# Patient Record
Sex: Female | Born: 1995 | Race: Black or African American | Hispanic: No | Marital: Single | Smoking: Never smoker
Health system: Southern US, Community
[De-identification: ages and names within clinical notes are randomized; demographics above are authoritative.]

## PROBLEM LIST (undated history)

## (undated) DIAGNOSIS — F909 Attention-deficit hyperactivity disorder, unspecified type: Secondary | ICD-10-CM

## (undated) DIAGNOSIS — F32A Depression, unspecified: Secondary | ICD-10-CM

## (undated) DIAGNOSIS — D509 Iron deficiency anemia, unspecified: Secondary | ICD-10-CM

## (undated) HISTORY — PX: RECONSTRUCTION, FACIAL: SHX5034

## (undated) HISTORY — PX: FACIAL RECONSTRUCTION SURGERY: SHX631

---
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
## 2016-04-25 ENCOUNTER — Emergency Department (HOSPITAL_BASED_OUTPATIENT_CLINIC_OR_DEPARTMENT_OTHER)
Admission: EM | Admit: 2016-04-25 | Discharge: 2016-04-25 | Disposition: A | Discharge status: Home / Self Care | Payer: Medicaid Other | Attending: Emergency Medicine | Admitting: Emergency Medicine

## 2016-04-25 ENCOUNTER — Encounter (HOSPITAL_BASED_OUTPATIENT_CLINIC_OR_DEPARTMENT_OTHER): Payer: Self-pay

## 2016-04-25 DIAGNOSIS — J029 Acute pharyngitis, unspecified: Secondary | ICD-10-CM

## 2016-04-25 DIAGNOSIS — R05 Cough: Secondary | ICD-10-CM | POA: Diagnosis present

## 2016-04-25 HISTORY — DX: Attention-deficit hyperactivity disorder, unspecified type: F90.9

## 2016-04-25 LAB — RAPID STREP SCREEN (MED CTR MEBANE ONLY): Streptococcus, Group A Screen (Direct): NEGATIVE

## 2016-04-25 MED ORDER — IBUPROFEN 400 MG PO TABS
600.0000 mg | ORAL_TABLET | Freq: Once | ORAL | Status: AC
  Administered 2016-04-25: 600 mg via ORAL
  Filled 2016-04-25: qty 1

## 2016-04-25 NOTE — ED Notes (Signed)
Sore throat x today-pt NAD-texting in triage

## 2016-04-25 NOTE — ED Provider Notes (Signed)
CSN: [id number]     Arrival date & time 04/25/16  2055 History   By signing my name below, I, Linus Galas, attest that this documentation has been prepared under the direction and in the presence of No att. providers found. Electronically Signed: Linus Galas, ED Scribe. 04/25/2016. 10:32 PM.   Chief Complaint  Patient presents with  . Sore Throat    The history is provided by the patient. No language interpreter was used.   HPI Comments: Amber Hobbs is a 20 y.o. female who presents to the Emergency Department complaining of "burnging" sore throat that began this morning. Pt also reports cough and nasal congestion. Pt has not tried at OTC medications for her symptoms. Pt denies any fever, chills, CP, SOB, N/V/D or any other symptoms at this time.  Immunizations are up to date.  No recent travel. No difficulty breathing or swallowing.    Past Medical History  Diagnosis Date  . ADHD (attention deficit hyperactivity disorder)    Past Surgical History  Procedure Laterality Date  . Facial reconstruction surgery     No family history on file. Social History  Substance Use Topics  . Smoking status: Never Smoker   . Smokeless tobacco: None  . Alcohol Use: No   OB History    No data available     Review of Systems  Constitutional: Negative for fever and chills.  HENT: Positive for congestion and sore throat.   Respiratory: Positive for cough. Negative for shortness of breath.   Cardiovascular: Negative for chest pain.  Gastrointestinal: Negative for nausea, vomiting, abdominal pain and diarrhea.  All other systems reviewed and are negative.  Allergies  Review of patient's allergies indicates no known allergies.  Home Medications   Prior to Admission medications   Not on File   BP 126/80 mmHg  Pulse 93  Temp(Src) 98.5 F (36.9 C) (Oral)  Resp 18  Ht  (1.626 m)  Wt 155 lb (70.308 kg)  BMI 26.59 kg/m2  SpO2 100%  LMP 04/20/2016  Vitals reviewed Physical  Exam  Physical Examination: General appearance - alert, well appearing, and in no distress Mental status - alert, oriented to person, place, and time Eyes - no conjunctival injection, no scleral icterus Mouth - mucous membranes moist, pharynx normal without lesions Chest - clear to auscultation, no wheezes, rales or rhonchi, symmetric air entry Heart - normal rate, regular rhythm, normal S1, S2, no murmurs, rubs, clicks or gallops Abdomen - soft, nontender, nondistended, no masses or organomegaly Neurological - alert, oriented, normal speech Extremities - peripheral pulses normal, no pedal edema, no clubbing or cyanosis Skin - normal coloration and turgor, no rashes  ED Course  Procedures  DIAGNOSTIC STUDIES: Oxygen Saturation is 100% on room air, normal by my interpretation.    COORDINATION OF CARE: 10:30 PM Will give ibuprofen. Will order rapid strep and culture. Discussed treatment plan with pt at bedside and pt agreed to plan.  Labs Review Labs Reviewed  RAPID STREP SCREEN (NOT AT [facility])  CULTURE, GROUP A STREP [facility])    Imaging Review No results found. I have personally reviewed and evaluated these images and lab results as part of my medical decision-making.   EKG Interpretation None      MDM   Final diagnoses:  Pharyngitis    Pt presenting with c/o sore throat x 1 day associated with mild cough and nasal congestion.  No erythema of throat on exam.  Rapid strep negative.   Patient is overall  nontoxic and well hydrated in appearance. Strep culture pending.  Advised ibuprofen and salt water gargle.  Discharged with strict return precautions.  Pt agreeable with plan.   I personally performed the services described in this documentation, which was scribed in my presence. The recorded information has been reviewed and is accurate.     Jerelyn ScottMartha Linker, MD 04/25/16 [phone]

## 2016-04-25 NOTE — Discharge Instructions (Signed)
Return to the ED with any concerns including difficulty breathing, vomiting and not able to keep down liquids, decreased urine output, decreased level of alertness/lethargy, or any other alarming symptoms  °

## 2016-04-28 LAB — CULTURE, GROUP A STREP (THRC)

## 2016-08-18 ENCOUNTER — Emergency Department (HOSPITAL_BASED_OUTPATIENT_CLINIC_OR_DEPARTMENT_OTHER)
Admission: EM | Admit: 2016-08-18 | Discharge: 2016-08-18 | Disposition: A | Discharge status: Home / Self Care | Payer: Medicaid Other | Attending: Dermatology | Admitting: Dermatology

## 2016-08-18 DIAGNOSIS — Z048 Encounter for examination and observation for other specified reasons: Secondary | ICD-10-CM | POA: Insufficient documentation

## 2016-08-18 DIAGNOSIS — Y999 Unspecified external cause status: Secondary | ICD-10-CM | POA: Insufficient documentation

## 2016-08-18 DIAGNOSIS — F909 Attention-deficit hyperactivity disorder, unspecified type: Secondary | ICD-10-CM | POA: Insufficient documentation

## 2016-08-18 DIAGNOSIS — Z5321 Procedure and treatment not carried out due to patient leaving prior to being seen by health care provider: Secondary | ICD-10-CM | POA: Diagnosis not present

## 2016-08-18 DIAGNOSIS — Y929 Unspecified place or not applicable: Secondary | ICD-10-CM | POA: Insufficient documentation

## 2016-08-18 DIAGNOSIS — X58XXXA Exposure to other specified factors, initial encounter: Secondary | ICD-10-CM | POA: Insufficient documentation

## 2016-08-18 DIAGNOSIS — Y939 Activity, unspecified: Secondary | ICD-10-CM | POA: Insufficient documentation

## 2016-08-18 NOTE — ED Triage Notes (Signed)
Have called pt for triage at 2 separate occasions -- pt hasn't been present in waiting room at either time.

## 2016-08-18 NOTE — ED Notes (Signed)
Pt called a 3rd time for triage -- pt not present in waiting room, bathrooms or hallway.

## 2016-11-24 IMAGING — CR DG FOOT COMPLETE 3+V*L*
3 series · 3 of 3 positions shown · non-contrast
Comparison: None.

CLINICAL DATA: The patient ran into a door frame last night
resulting in a left foot injury and pain. Initial encounter.

EXAM:
LEFT FOOT - COMPLETE 3+ VIEW

[t foot ap left]
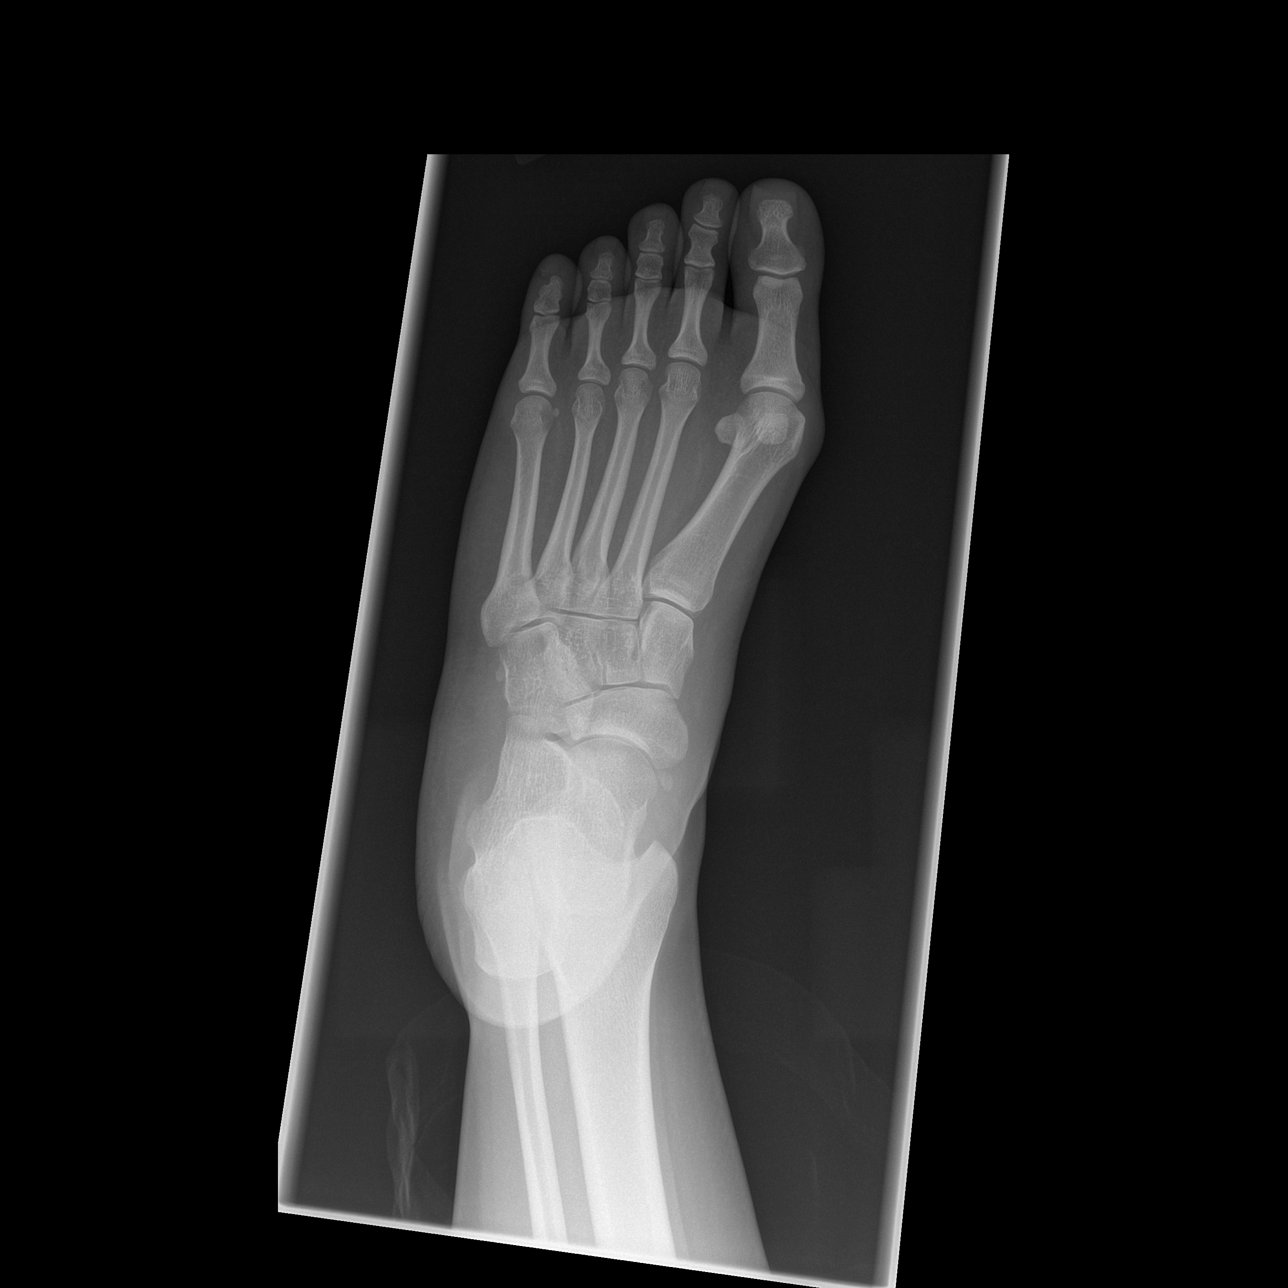

[t foot oblique left]
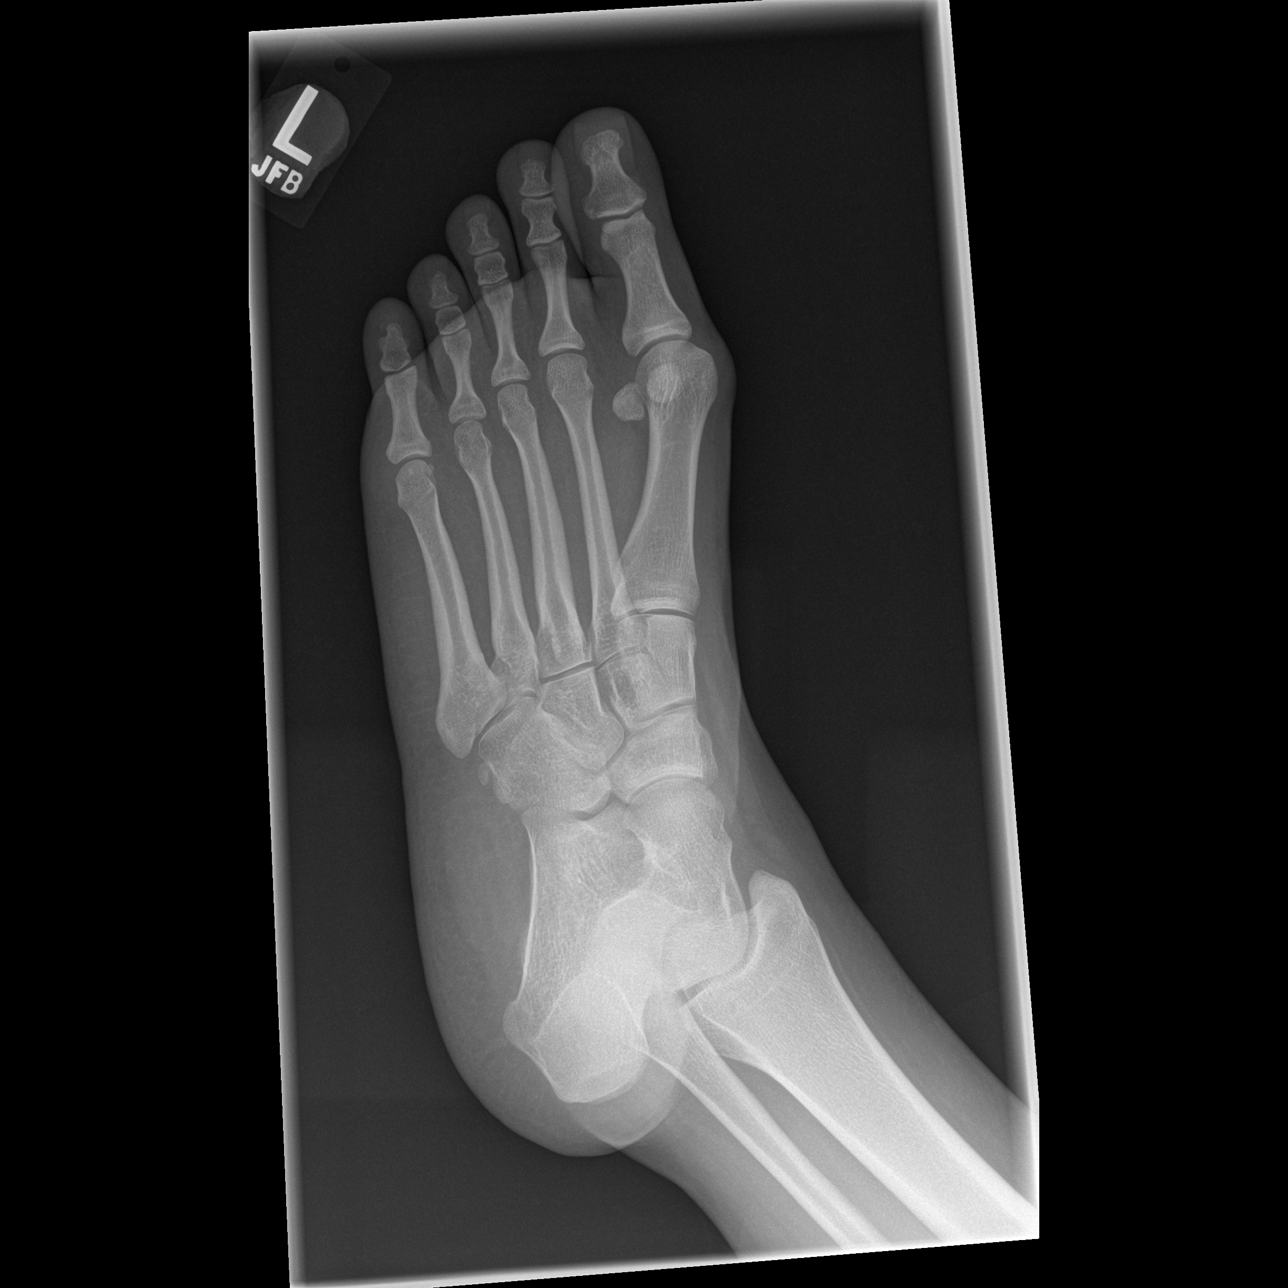

[t foot lat left]
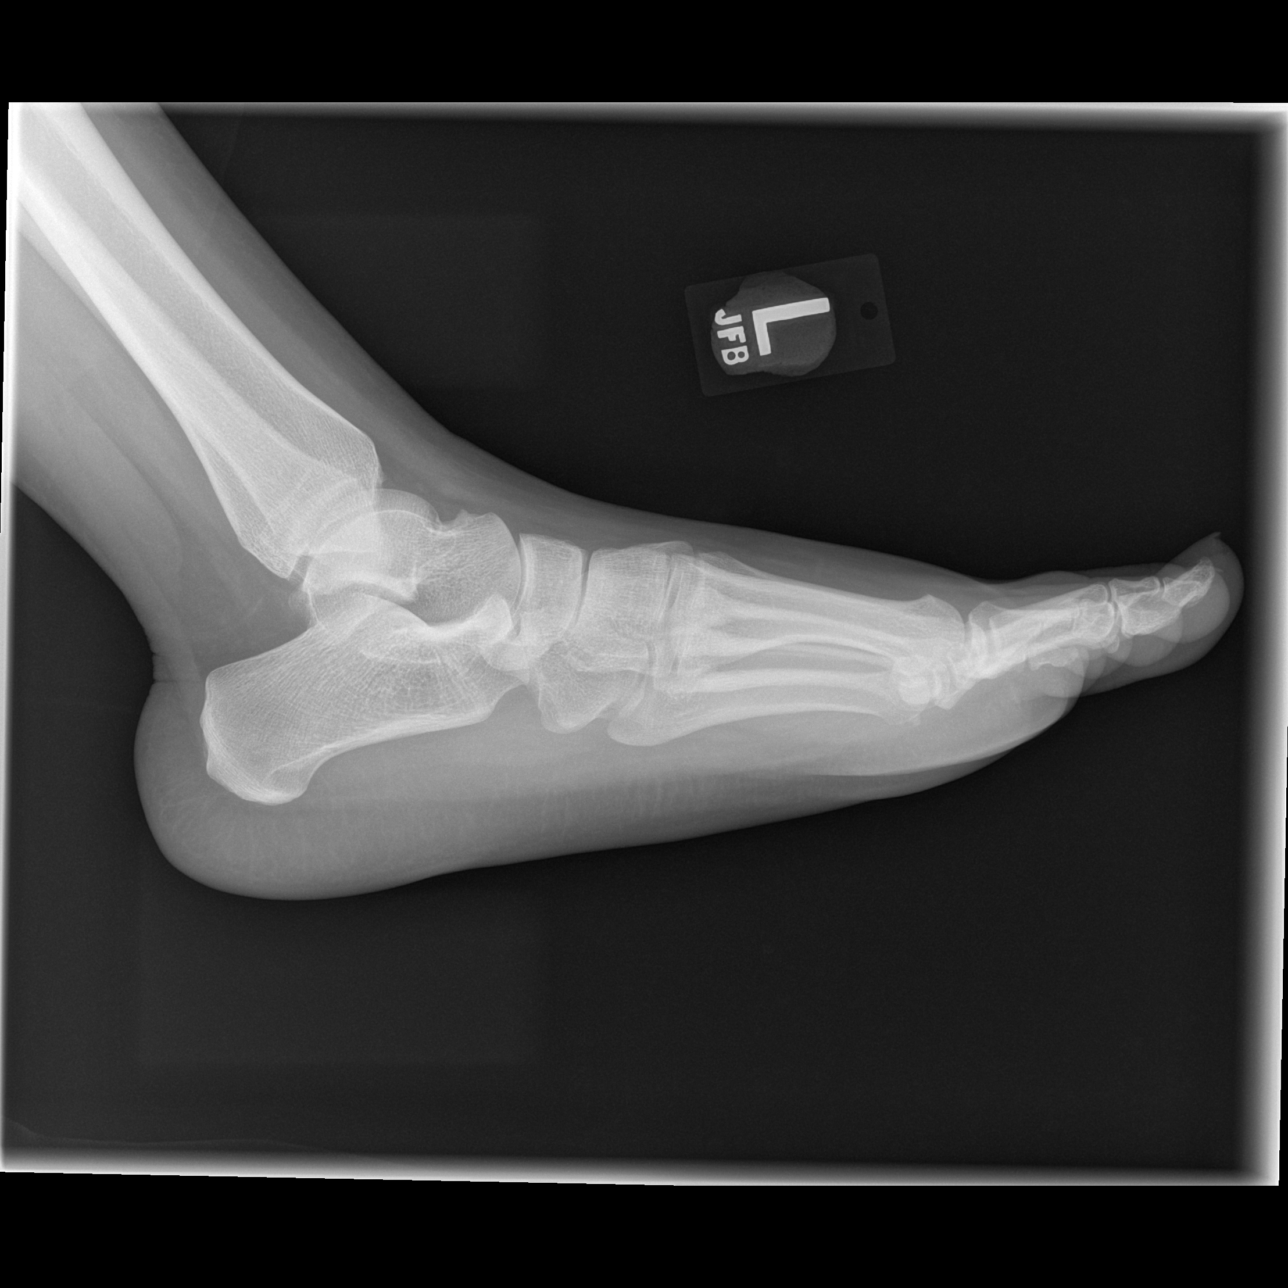

[3 of 3 positions shown; findings below may reference images not displayed]

FINDINGS: No acute bony or joint abnormality is identified. Mild hallux valgus
is noted. Soft tissues are unremarkable.
IMPRESSION: No acute abnormality.

## 2017-04-16 ENCOUNTER — Encounter (HOSPITAL_BASED_OUTPATIENT_CLINIC_OR_DEPARTMENT_OTHER): Payer: Self-pay

## 2017-04-16 ENCOUNTER — Emergency Department (HOSPITAL_BASED_OUTPATIENT_CLINIC_OR_DEPARTMENT_OTHER)
Admission: EM | Admit: 2017-04-16 | Discharge: 2017-04-16 | Disposition: A | Discharge status: Home / Self Care | Payer: Medicaid Other | Attending: Emergency Medicine | Admitting: Emergency Medicine

## 2017-04-16 DIAGNOSIS — F909 Attention-deficit hyperactivity disorder, unspecified type: Secondary | ICD-10-CM | POA: Diagnosis not present

## 2017-04-16 DIAGNOSIS — N760 Acute vaginitis: Secondary | ICD-10-CM | POA: Diagnosis not present

## 2017-04-16 DIAGNOSIS — A5901 Trichomonal vulvovaginitis: Secondary | ICD-10-CM | POA: Insufficient documentation

## 2017-04-16 DIAGNOSIS — B9689 Other specified bacterial agents as the cause of diseases classified elsewhere: Secondary | ICD-10-CM

## 2017-04-16 DIAGNOSIS — J020 Streptococcal pharyngitis: Secondary | ICD-10-CM | POA: Diagnosis not present

## 2017-04-16 DIAGNOSIS — N898 Other specified noninflammatory disorders of vagina: Secondary | ICD-10-CM | POA: Diagnosis present

## 2017-04-16 LAB — URINALYSIS, MICROSCOPIC (REFLEX)

## 2017-04-16 LAB — URINALYSIS, ROUTINE W REFLEX MICROSCOPIC
Bilirubin Urine: NEGATIVE
GLUCOSE, UA: NEGATIVE mg/dL
Ketones, ur: 15 mg/dL — AB
Nitrite: NEGATIVE
PH: 6.5 (ref 5.0–8.0)
Protein, ur: NEGATIVE mg/dL
Specific Gravity, Urine: 1.022 (ref 1.005–1.030)

## 2017-04-16 LAB — WET PREP, GENITAL
Sperm: NONE SEEN
YEAST WET PREP: NONE SEEN

## 2017-04-16 LAB — RAPID STREP SCREEN (MED CTR MEBANE ONLY): Streptococcus, Group A Screen (Direct): POSITIVE — AB

## 2017-04-16 LAB — PREGNANCY, URINE: PREG TEST UR: NEGATIVE

## 2017-04-16 MED ORDER — AMOXICILLIN 500 MG PO CAPS: 500.0000 mg | ORAL_CAPSULE | Freq: Three times a day (TID) | ORAL | 0 refills | Status: AC

## 2017-04-16 MED ORDER — METRONIDAZOLE 500 MG PO TABS: 500.0000 mg | ORAL_TABLET | Freq: Two times a day (BID) | ORAL | 0 refills | Status: AC

## 2017-04-16 NOTE — ED Notes (Signed)
ED Provider at bedside. 

## 2017-04-16 NOTE — ED Notes (Signed)
Pt verbalized understanding of discharge instructions and denies any further questions at this time.   

## 2017-04-16 NOTE — ED Triage Notes (Signed)
Vaginal d/c x 2 weeks-sore thraot x today-NAD-steady gait

## 2017-04-16 NOTE — ED Provider Notes (Signed)
[facility]   CSN: [id number] Arrival date & time: 04/16/17  1516     History   Chief Complaint Chief Complaint  Patient presents with  . Vaginal Discharge    HPI Amber Hobbs is a 21 y.o. female.  HPI Patient presents with yellowish vaginal discharge. Began around 2 weeks ago. No pain. Denies unprotected sex. No dysuria. No abdominal pain. No vaginal pain. Patient also presents with sore throat. Said that the last couple days. No cough. States she has a niece that has strep throat. States that he has tested positive. Patient denies having oral sex.   Past Medical History:  Diagnosis Date  . ADHD (attention deficit hyperactivity disorder)     There are no active problems to display for this patient.   Past Surgical History:  Procedure Laterality Date  . FACIAL RECONSTRUCTION SURGERY      OB History    No data available       Home Medications    Prior to Admission medications   Medication Sig Start Date End Date Taking? Authorizing Provider  amoxicillin (AMOXIL) 500 MG capsule Take 1 capsule (500 mg total) by mouth 3 (three) times daily. 04/16/17   Benjiman Core, MD  metroNIDAZOLE (FLAGYL) 500 MG tablet Take 1 tablet (500 mg total) by mouth 2 (two) times daily. 04/16/17   Benjiman Core, MD    Family History No family history on file.  Social History Social History  Substance Use Topics  . Smoking status: Never Smoker  . Smokeless tobacco: Never Used  . Alcohol use No     Allergies   Patient has no known allergies.   Review of Systems Review of Systems  Constitutional: Negative for appetite change.  HENT: Positive for sore throat. Negative for congestion.   Respiratory: Negative for cough and shortness of breath.   Cardiovascular: Negative for chest pain.  Gastrointestinal: Negative for abdominal pain.  Genitourinary: Positive for vaginal discharge. Negative for dysuria, vaginal bleeding and vaginal pain.    Musculoskeletal: Negative for back pain.  Neurological: Negative for tremors.  Hematological: Negative for adenopathy.  Psychiatric/Behavioral: Negative for confusion.     Physical Exam Updated Vital Signs BP 118/80 (BP Location: Right Arm)   Pulse 71   Temp 98.2 F (36.8 C) (Oral)   Resp 16   Ht 5\' 4"  (1.626 m)   Wt 150 lb (68 kg)   SpO2 100%   BMI 25.75 kg/m   Physical Exam  Constitutional: She appears well-developed and well-nourished.  HENT:  Mild posterior pharyngeal erythema without exudate.  Neck: Neck supple.  Cardiovascular: Normal rate.   Pulmonary/Chest: Effort normal.  Abdominal: There is no tenderness.  Musculoskeletal: She exhibits no edema.  Neurological: She is alert.  Skin: Skin is warm. Capillary refill takes less than 2 seconds.  Psychiatric: She has a normal mood and affect.  Pelvic exam shows white discharge. No cervical motion tenderness. No lesions. No adnexal tenderness.   ED Treatments / Results  Labs (all labs ordered are listed, but only abnormal results are displayed) Labs Reviewed  RAPID STREP SCREEN (NOT AT [facility]) - Abnormal; Notable for the following:       Result Value   Streptococcus, Group A Screen (Direct) POSITIVE (*)    All other components within normal limits  WET PREP, GENITAL - Abnormal; Notable for the following:    Trich, Wet Prep PRESENT (*)    Clue Cells Wet Prep HPF POC PRESENT (*)    WBC,  Wet Prep HPF POC MANY (*)    All other components within normal limits  URINALYSIS, ROUTINE W REFLEX MICROSCOPIC - Abnormal; Notable for the following:    Hgb urine dipstick TRACE (*)    Ketones, ur 15 (*)    Leukocytes, UA MODERATE (*)    All other components within normal limits  URINALYSIS, MICROSCOPIC (REFLEX) - Abnormal; Notable for the following:    Bacteria, UA MANY (*)    Squamous Epithelial / LPF 0-5 (*)    All other components within normal limits  PREGNANCY, URINE  RPR  HIV ANTIBODY (ROUTINE TESTING)  GC/CHLAMYDIA  PROBE AMP ([place]) NOT AT [facility]    EKG  EKG Interpretation None       Radiology No results found.  Procedures Procedures (including critical care time)  Medications Ordered in ED Medications - No data to display   Initial Impression / Assessment and Plan / ED Course  I have reviewed the triage vital signs and the nursing notes.  Pertinent labs & imaging results that were available during my care of the patient were reviewed by me and considered in my medical decision making (see chart for details).     Patient with vaginal discharge and sore throat. Strep positive. Denies oral sex. Will treat for trichomoniasis and BV. Other labs pending. Discharge home.  Final Clinical Impressions(s) / ED Diagnoses   Final diagnoses:  BV (bacterial vaginosis)  Trichomonas vaginitis  Strep pharyngitis    New Prescriptions New Prescriptions   AMOXICILLIN (AMOXIL) 500 MG CAPSULE    Take 1 capsule (500 mg total) by mouth 3 (three) times daily.   METRONIDAZOLE (FLAGYL) 500 MG TABLET    Take 1 tablet (500 mg total) by mouth 2 (two) times daily.     Benjiman CorePickering, Fifi Schindler, MD 04/16/17 [id number]

## 2017-04-17 LAB — GC/CHLAMYDIA PROBE AMP (~~LOC~~) NOT AT ARMC
Chlamydia: NEGATIVE
Neisseria Gonorrhea: NEGATIVE

## 2017-04-17 LAB — RPR: RPR Ser Ql: NONREACTIVE

## 2017-04-17 LAB — HIV ANTIBODY (ROUTINE TESTING W REFLEX): HIV Screen 4th Generation wRfx: NONREACTIVE

## 2023-06-25 ENCOUNTER — Emergency Department
Admission: EM | Admit: 2023-06-25 | Discharge: 2023-06-25 | Disposition: A | Discharge status: Home / Self Care | Payer: Medicaid Other | Attending: Emergency Medicine | Admitting: Emergency Medicine

## 2023-06-25 ENCOUNTER — Encounter (INDEPENDENT_AMBULATORY_CARE_PROVIDER_SITE_OTHER): Payer: Self-pay

## 2023-06-25 ENCOUNTER — Encounter (INDEPENDENT_AMBULATORY_CARE_PROVIDER_SITE_OTHER): Payer: Medicaid Other

## 2023-06-25 DIAGNOSIS — N3000 Acute cystitis without hematuria: Secondary | ICD-10-CM | POA: Insufficient documentation

## 2023-06-25 LAB — COMPREHENSIVE METABOLIC PANEL
ALT: 11 U/L (ref 0–55)
AST (SGOT): 15 U/L (ref 5–41)
Albumin/Globulin Ratio: 1.4 (ref 0.9–2.2)
Albumin: 3.8 g/dL (ref 3.5–5.0)
Alkaline Phosphatase: 43 U/L (ref 37–117)
Anion Gap: 6 (ref 5.0–15.0)
BUN: 8 mg/dL (ref 7–21)
Bilirubin, Total: 0.3 mg/dL (ref 0.2–1.2)
CO2: 25 mEq/L (ref 17–29)
Calcium: 9.4 mg/dL (ref 8.5–10.5)
Chloride: 109 mEq/L (ref 99–111)
Creatinine: 0.8 mg/dL (ref 0.4–1.0)
GFR: >60 mL/min/{1.73_m2} (ref >=60.0)
Globulin: 2.8 g/dL (ref 2.0–3.6)
Glucose: 87 mg/dL (ref 70–100)
Potassium: 4.9 mEq/L (ref 3.5–5.3)
Protein, Total: 6.6 g/dL (ref 6.0–8.3)
Sodium: 140 mEq/L (ref 135–145)

## 2023-06-25 LAB — LAB USE ONLY - CBC WITH DIFFERENTIAL
Absolute Basophils: 0.01 10*3/uL (ref 0.00–0.08)
Absolute Eosinophils: 0.16 10*3/uL (ref 0.00–0.44)
Absolute Immature Granulocytes: 0.02 10*3/uL (ref 0.00–0.07)
Absolute Lymphocytes: 1.43 10*3/uL (ref 0.42–3.22)
Absolute Monocytes: 0.68 10*3/uL (ref 0.21–0.85)
Absolute Neutrophils: 5.22 10*3/uL (ref 1.10–6.33)
Absolute nRBC: 0 10*3/uL (ref <=0.00)
Basophils %: 0.1 %
Eosinophils %: 2.1 %
Hematocrit: 40.9 % (ref 34.7–43.7)
Hemoglobin: 13 g/dL (ref 11.4–14.8)
Immature Granulocytes %: 0.3 %
Lymphocytes %: 19 %
MCH: 29.5 pg (ref 25.1–33.5)
MCHC: 31.8 g/dL (ref 31.5–35.8)
MCV: 93 fL (ref 78.0–96.0)
MPV: 10.3 fL (ref 8.9–12.5)
Monocytes %: 9 %
Neutrophils %: 69.5 %
Platelet Count: 287 10*3/uL (ref 142–346)
Preliminary Absolute Neutrophil Count: 5.22 10*3/uL (ref 1.10–6.33)
RBC: 4.4 10*6/uL (ref 3.90–5.10)
RDW: 13 % (ref 11–15)
WBC: 7.52 10*3/uL (ref 3.10–9.50)
nRBC %: 0 /100 WBC (ref <=0.0)

## 2023-06-25 LAB — LIPASE: Lipase: 11 U/L (ref 8–78)

## 2023-06-25 LAB — LAB USE ONLY - URINE GRAY CULTURE HOLD TUBE

## 2023-06-25 LAB — LAB USE ONLY - URINALYSIS WITH REFLEX TO MICROSCOPIC EXAM AND CULTURE
Urine Bilirubin: NEGATIVE
Urine Glucose: NEGATIVE
Urine Ketones: NEGATIVE mg/dL
Urine Nitrite: NEGATIVE
Urine Specific Gravity: 1.028 (ref 1.001–1.035)
Urine Urobilinogen: NORMAL mg/dL (ref 0.2–2.0)
Urine pH: 6.5 (ref 5.0–8.0)

## 2023-06-25 LAB — BETA HCG QUANTITATIVE, PREGNANCY: hCG, Quantitative: <2.4 m[IU]/mL

## 2023-06-25 MED ORDER — KETOROLAC TROMETHAMINE 30 MG/ML IJ SOLN
30.0000 mg | Freq: Once | INTRAMUSCULAR | Status: AC
Start: 2023-06-25 — End: 2023-06-25
  Administered 2023-06-25: 30 mg via INTRAVENOUS
  Filled 2023-06-25: qty 1

## 2023-06-25 MED ORDER — CEPHALEXIN 500 MG PO CAPS
500.0000 mg | ORAL_CAPSULE | Freq: Once | ORAL | Status: AC
Start: 2023-06-25 — End: 2023-06-25
  Administered 2023-06-25: 500 mg via ORAL
  Filled 2023-06-25: qty 1

## 2023-06-25 MED ORDER — ONDANSETRON 4 MG PO TBDP
4.0000 mg | ORAL_TABLET | Freq: Four times a day (QID) | ORAL | 0 refills | Status: AC | PRN
Start: 2023-06-25 — End: ?

## 2023-06-25 MED ORDER — CEPHALEXIN 500 MG PO CAPS
500.0000 mg | ORAL_CAPSULE | Freq: Four times a day (QID) | ORAL | 0 refills | Status: AC
Start: 2023-06-25 — End: 2023-07-02

## 2023-06-25 MED ORDER — ONDANSETRON 4 MG PO TBDP
4.0000 mg | ORAL_TABLET | Freq: Once | ORAL | Status: AC
Start: 2023-06-25 — End: 2023-06-25
  Administered 2023-06-25: 4 mg via ORAL
  Filled 2023-06-25: qty 1

## 2023-06-25 NOTE — ED Provider Notes (Signed)
[facility] EMERGENCY DEPARTMENT H&P           CLINICAL INFORMATION        HPI:      Chief Complaint: Abdominal Pain and Urinary Tract Infection Symptoms  .    Haley Roman is a 27 y.o. female who presents with lower abdominal pain, burning with urination, frequency.  Consistent with previous UTIs but more severe.  Also notes her history of ruptured ovarian cyst and pain feels similar.  Not sudden onset, notes some nausea but no vomiting.  Denies fevers, diarrhea, bowel symptoms.  Denies vaginal discharge.    History obtained from: Patient    Triage Note: Presents w/ c/o abdominal pain/pelvic pain w/ urinary frequency & hematuria that started x3 days ago. Denies fever/n/v/diarrhea. Hx of ovarian cysts. Arrives ambulatory/A&Ox4. Tylenol at 1300. (06/25/23 1517)      Physical Exam:      Vitals:    06/25/23 1514 06/25/23 1517 06/25/23 1644   BP: 120/89  112/72   Pulse: 62  (!) 53   Resp: 20  16   Temp: 98.2 F (36.8 C)  97.9 F (36.6 C)   TempSrc: Oral  Oral   SpO2: 100%  100%   Weight:  94.3 kg    Height:  5\' 4"  (1.626 m)        Nursing notes and vitals reviewed.     Constitutional: alert, no acute distress  Eyes: EOM intact, conjugate gaze  HEENT: Neck supple with normal ROM  Cardiac: Warm and well perfused  Respiratory: Normal rate, No increased work of breathing   Abdomen/GI:  nondistended, no CVA tenderness, mild lower abdominal tenderness, repeated palpation right lower quadrant without pain.  Neurologic: alert, oriented, conversant, moves all extremities, stable gait  MSK: No deformities or crepitus, full ROM of joints without pain or limitation  Skin: warm and dry, no obvious rashes  Psychiatric: pleasant and cooperative            PAST HISTORY        Primary Care Provider: Pcp, None, MD        PMH/PSH:    .     Medical History[1]    She has no past surgical history on file.      Social/Family History:      She reports that she has never smoked. She has never used  smokeless tobacco. She reports that she does not drink alcohol and does not use drugs.    History reviewed. No pertinent family history.      Listed Medications on Arrival:    .     Home Medications       Med List Status: In Progress Set By: Douglass Rivers, RN at 06/25/2023  4:19 PM   No Medications        Allergies: She has No Known Allergies.            VISIT INFORMATION        Medications Given in the ED:    .     ED Medication Orders (From admission, onward)      Start Ordered     Status Ordering Provider    06/25/23 1604 06/25/23 1603  ketorolac (TORADOL) injection 30 mg  Once        Route: Intravenous  Ordered Dose: 30 mg       Last MAR action: Given Kasper Mudrick G    06/25/23 1602 06/25/23 1601  cephALEXin (KEFLEX) capsule 500 mg  Once  Route: Oral  Ordered Dose: 500 mg       Last MAR action: Given Lissandra Keil G    06/25/23 1602 06/25/23 1601  ondansetron (ZOFRAN-ODT) disintegrating tablet 4 mg  Once        Route: Oral  Ordered Dose: 4 mg       Last MAR action: Given Kaedyn Polivka G              Procedures:      Procedures      Interpretations:      O2 sat-           saturation: 100 %; Oxygen use: room air; Interpretation: Normal                                RESULTS        Lab Results:      Results       Procedure Component Value Units Date/Time    Urinalysis with Reflex to Microscopic Exam and Culture (Component) [[id number]]  (Abnormal) Collected: 06/25/23 1537    Specimen: Urine, Clean Catch Updated: 06/25/23 1559     Urine Color Yellow     Urine Clarity Turbid     Urine Specific Gravity 1.028     Urine pH 6.5     Urine Leukocyte Esterase Large     Urine Nitrite Negative     Urine Protein 200= 2+     Urine Glucose Negative     Urine Ketones Negative mg/dL      Urine Urobilinogen Normal mg/dL      Urine Bilirubin Negative     Urine Blood Large     RBC, UA Too numerous to count /hpf      Urine WBC Too numerous to count /hpf      Urine Squamous Epithelial Cells 6-10 /hpf      Urine Mucus Present     Culture, Urine [[id number]] Collected: 06/25/23 1537    Specimen: Urine, Clean Catch Updated: 06/25/23 1559    Beta HCG Quantitative, Pregnancy [[id number]] Collected: 06/25/23 1526    Specimen: Blood, Venous Updated: 06/25/23 1554     hCG, Quantitative <2.4 mIU/mL     Narrative:      PREGNANCY TEST INTERPRETATION  Negative for pregnancy         <5.0 mIU/mL  Indeterminate for pregnancy   5.0 - 24.9 mIU/mL - Retest after                                48 hours (recommended) or retest                                 by alternate methodology.  Positive for pregnancy        >=25.0 mIU/mL      APPROXIMATE GESTATIONAL AGE   APPROXIMATE HCG mIU/mL  0-1 WEEKS                          <50      mIU/mL  1-2 WEEKS                        40-300     mIU/mL  2-3 WEEKS  [phone]    mIU/mL  3-4 WEEKS                       [phone]    mIU/mL  4-8 WEEKS                     5,000-200,000 mIU/mL   8-12 WEEKS                   10,000-100,000 mIU/mL  2ND TRIMESTER                 3,000-50,000  mIU/mL  3RD TRIMESTER                 1,000-50,000  mIU/mL    This total Beta-hCG assay is approved for the testing  of pregnancy ONLY. It is not approved for any other  uses such as tumor marker screening or tumor marker  monitoring.    Rarely, some healthy, non-pregnant women may have higher  levels of BHCG.    Post menopausal women may elicit weakly positive results due  to low BHCG levels unrelated to pregnancy.    Some rare types of tumors may produce low levels BHCG.    Ectopic pregnancy and early natural termination cannot  be distinguished from early normal pregnancy by BHCG level  alone.    Comprehensive Metabolic Panel [[id number]]  (Normal) Collected: 06/25/23 1526    Specimen: Blood, Venous Updated: 06/25/23 1554     Glucose 87 mg/dL      BUN 8 mg/dL      Creatinine 0.8 mg/dL      Sodium 086 mEq/L      Potassium 4.9 mEq/L      Chloride 109 mEq/L      CO2 25 mEq/L      Calcium 9.4 mg/dL      Anion Gap 6.0     GFR  >60.0 mL/min/1.73 m2      AST (SGOT) 15 U/L      ALT 11 U/L      Alkaline Phosphatase 43 U/L      Albumin 3.8 g/dL      Protein, Total 6.6 g/dL      Globulin 2.8 g/dL      Albumin/Globulin Ratio 1.4     Bilirubin, Total 0.3 mg/dL     Lipase [[id number]]  (Normal) Collected: 06/25/23 1526    Specimen: Blood, Venous Updated: 06/25/23 1554     Lipase 11 U/L     CBC with Differential (Order) [[id number]] Collected: 06/25/23 1526    Specimen: Blood, Venous Updated: 06/25/23 1534    Narrative:      The following orders were created for panel order CBC with Differential (Order).  Procedure                               Abnormality         Status                     ---------                               -----------         ------                     CBC with Differential (C.Marland KitchenMarland Kitchen[[id number]]  Final result                 Please view results for these tests on the individual orders.    CBC with Differential (Component) [[id number]] Collected: 06/25/23 1526    Specimen: Blood, Venous Updated: 06/25/23 1534     WBC 7.52 x10 3/uL      Hemoglobin 13.0 g/dL      Hematocrit 32.9 %      Platelet Count 287 x10 3/uL      MPV 10.3 fL      RBC 4.40 x10 6/uL      MCV 93.0 fL      MCH 29.5 pg      MCHC 31.8 g/dL      RDW 13 %      nRBC % 0.0 /100 WBC      Absolute nRBC 0.00 x10 3/uL      Preliminary Absolute Neutrophil Count 5.22 x10 3/uL      Neutrophils % 69.5 %      Lymphocytes % 19.0 %      Monocytes % 9.0 %      Eosinophils % 2.1 %      Basophils % 0.1 %      Immature Granulocytes % 0.3 %      Absolute Neutrophils 5.22 x10 3/uL      Absolute Lymphocytes 1.43 x10 3/uL      Absolute Monocytes 0.68 x10 3/uL      Absolute Eosinophils 0.16 x10 3/uL      Absolute Basophils 0.01 x10 3/uL      Absolute Immature Granulocytes 0.02 x10 3/uL                 Radiology Results:      No orders to display              Visit date: 06/25/2023      CLINICAL SUMMARY           Diagnosis:    .     Final diagnoses:   Acute cystitis without  hematuria         MDM/ED Course:        Symptoms consistent with UTI.  Urinalysis suggestive of UTI.  Has no right lower quadrant pain, think appendicitis, other intra-abdominal pathology unlikely.  Not consistent with torsion.  Discussed most likely diagnosis, return instructions, SPECT course at home with Keflex and Zofran.  Patient expressed understanding.                      Disposition Decision:      Discharge    Standard discharge instructions including symptomatic management, expected course of disease as outpatient, return instructions, follow-up plan discussed with patient.  Patient confirmed understanding of instructions                   [1] History reviewed. No pertinent past medical history.

## 2023-12-30 ENCOUNTER — Emergency Department: Payer: 59

## 2023-12-30 ENCOUNTER — Emergency Department
Admission: EM | Admit: 2023-12-30 | Discharge: 2023-12-30 | Disposition: A | Discharge status: Home / Self Care | Payer: 59 | Attending: Emergency Medicine | Admitting: Emergency Medicine

## 2023-12-30 DIAGNOSIS — B349 Viral infection, unspecified: Secondary | ICD-10-CM | POA: Insufficient documentation

## 2023-12-30 LAB — COVID-19 (SARS-COV-2) & INFLUENZA  A/B, NAA (ROCHE LIAT)
Influenza A RNA: NOT DETECTED
Influenza B RNA: NOT DETECTED
SARS-CoV-2 (COVID-19) RNA: NOT DETECTED

## 2023-12-30 LAB — GROUP A STREP, RAPID ANTIGEN: Group A Strep, Rapid Antigen: NEGATIVE

## 2023-12-30 NOTE — Discharge Instructions (Signed)
You were evaluated in the Emergency Department today for your congestion, cough. Your evaluation suggests that your symptoms are most likely due to a viral illness, which will improve on its own with rest and fluids.  Please schedule an appointment for follow up with your primary care physician.  Return to the Emergency Department if you experience worsening cough, fever 100.4  F or greater not controlled by Tylenol or Ibuprofen, recurrent vomiting, chest pain, shortness of breath, or any other concerning symptoms.    Managing Symptoms of Upper Respiratory Infections (URI) for Adults     You can expect the symptoms of your cold or upper respiratory infection to last 14 to 21 days. A dry hacking cough may continue up to three or four weeks.     To help you recover:   Drink more fluids. Get enough rest. Use a humidifier or increase time in a steamy shower. Keep in mind that green or yellow secretions do not equal bacterial infection.     Additional recommendations for managing your symptoms:     Fever, headache, or pain   - Acetaminophen (TylenolT) 325 mg 2 tablets every 6 hours as needed for the first 5-7 days of infection.   -Acetaminophen (TylenolT) 500 mg, 2 tablets every 8 hours as needed for the first 5-7 days of infection.    Maximum dose: 3000 mg of acetaminophen in 24 hours.    Avoid combination products that contain acetaminophen (read the label) while taking scheduled acetaminophen.    Use the lowest effective dose for the shortest possible duration to reduce the risk of serious adverse effects.    - Ibuprofen (AdvilT, MotrinT) 200 mg, 3 tablets every 6 hours-8 hours.    Avoid ibuprofen if you have kidney disease, coronary heart disease, heart failure, or history of a gastric ulcer or gastric surgery    Maximum dose: 2400 mg of ibuprofen in 24 hours.    Use the lowest needed dose for the shortest possible time frame to reduce the risk of serious side effects. Do not use longer than 7 days, unless directed  by your healthcare provider.    Take with food to prevent getting an upset stomach.     Sore throat   - Take acetaminophen and/or ibuprofen as above.   - Use throat lozenges with benzocaine which help numb your sore throat (Cepacol, chloraseptic brands).   -Gargle with saltwater several times a day to help relieve throat pain. Mix 1/4 teaspoon (1.4 grams) of table salt in 8 ounces (237 milliliters) of warm water. Gargle the solution and then spit it out.     Sinus  Sinus drainage, sinus/nose/ear congestion (nose drainage, drainage in the back of the throat, sinus pressure, facial pain, nose stuffiness, ear pressure)   It is common to have nasal drainage of various colors with a viral cold or upper respiratory infection. These usually get better with time and do not require antibiotics.     - Saline sinus rinse - Mix and use according to directions on the product [employer]fir[employer], [place]).   -Nasal spray (Flonase, Nasacort) - 2 sprays per nostril once a day after a saline sinus irrigation.   -Oxymetazoline nasal spray (Afrin, Sinex)  Take two or three times a day for 3 days.    Do not use it longer than 3 days. After 3 days, use saline nasal spray or the saline sinus rinse   -Sudafed (pseudoephedrine) capsules - Take every 4 to 6 hours per package instructions for  sinus congestion. Available behind the pharmacy counter.    Avoid it if you have high blood pressure, heart disease or take beta blockers (atenolol, metoprolol, etc).    Do not exceed 240 mg per day.  Longer-acting medications may have more side effects such as restlessness and insomnia.     Cough   Avoid coughing too hard or too often. Excessive coughing may cause bronchial (tubes going to the lungs) irritation which could cause a cough-irritate-cough cycle that can prolong the cough for weeks.   - Honey: 1 to 2 teaspoons every 4 to 6 hours as needed.   - Cough drops every 4 to 6 hours as needed.   - Guaifenesin/dextromethorphan syrup - (Robitussin DM) per  package instructions. Do not take it if you are taking an antidepressant, opioid pain medication, sleeping medication, or antipsychotic medication.

## 2023-12-30 NOTE — ED Notes (Signed)
Discharged by PA.

## 2023-12-31 LAB — LAB USE ONLY - ESWAB HOLD TUBE

## 2024-01-02 LAB — CULTURE, THROAT

## 2024-01-07 NOTE — ED Provider Notes (Signed)
ED URI NOTE  Date: 12/30/2023   Patient Name: Haley Roman  Attending Physician: Dr. Loleta Dicker   Advanced Practice Provider: Everlene Balls, PA      ICD-10-CM    1. Acute viral syndrome  B34.9            Assessment      Patient likely has URI or other viral syndrome, is well appearing, nontoxic, and has a reassuring exam.  Would benefit from symptomatic care and is appropriate for outpatient care.   I do not think antibiotics are indicated at this time.       Medical Decision Making     Normal sats, not tachypnic, cta bil, normal RR, without neck pain or stiffness or photophobia    Initial vital signs reassuring.  Patient is normotensive without fever, tachycardia, hypoxia or tachypnea.    COVID-19, Influenza swabs obtained and were not detected.  Rapid strep negative.  Empiric antibiotic treatment not indicated per [facility] criteria though throat culture sent.    CXR does not show any acute process per my read.    Do not suspect underlying cardiopulmonary process.     Discussed diagnosis and treatment of URI.  Suggested symptomatic OTC remedies.  Follow up as needed.     I have discussed the use of antibiotics, and the patient understands that they are not medically indicated at this time due to likely viral disease. The patient understands the need for follow-up and the symptoms and signs that would mandate immediate return to the ED for re-evaluation.    Patient instructed to return to ED with any new or worsening symptoms, otherwise will followup with primary care or clinic as needed.    History of Present Illness     HPI:  Haley Roman is a 28 y.o. female presenting to the ER with complaints of cough, sore throat, nasal congestion.    She was diagnosed with flu 2 weeks ago.  Symptoms have persisted.  No new sick contacts at home.  No vomiting or shortness of breath.  Denies fevers.    ROS: As per HPI      Past History     PMH:   Medical History[1]    Allergies: Allergies[2]    Record Review     I have  reviewed Haley Roman  previous charts, medical history, nursing and triage notes. I have taken this information into consideration while formulating my diagnosis and treatment plan.     Physical Exam     Vital Signs:  Vitals:    12/30/23 1938   BP: 121/76   Pulse: (!) 59   Resp: 16   Temp: 97.6 F (36.4 C)   TempSrc: Oral   SpO2: 100%   Weight: 90.7 kg   Height: 5\' 4"  (1.626 m)        Physical Exam  Constitutional:       General: She is not in acute distress.     Appearance: She is not ill-appearing or toxic-appearing.   HENT:      Right Ear: Tympanic membrane normal.      Left Ear: Tympanic membrane normal.      Mouth/Throat:      Mouth: Mucous membranes are moist.      Pharynx: Oropharynx is clear. Uvula midline. Posterior oropharyngeal erythema present. No oropharyngeal exudate.   Eyes:      Extraocular Movements: Extraocular movements intact.      Pupils: Pupils are equal, round, and reactive to light.  Cardiovascular:      Rate and Rhythm: Normal rate and regular rhythm.      Heart sounds: Normal heart sounds.   Pulmonary:      Effort: Pulmonary effort is normal. No respiratory distress.      Breath sounds: Normal breath sounds.   Abdominal:      General: There is no distension.      Palpations: Abdomen is soft.      Tenderness: There is no abdominal tenderness.   Musculoskeletal:      Cervical back: Neck supple.   Skin:     Findings: No rash.   Neurological:      Mental Status: She is alert.        Diagnostic Study Results     EKG: N/A    Imaging Interpreted by me: As noted in ED course  XR Chest 2 Views   Final Result    No evidence of acute cardiopulmonary disease.      Darra Lis, MD   12/30/2023 8:09 PM          Labs:   Results       Procedure Component Value Units Date/Time    COVID-19 and Influenza (Liat) (symptomatic) [[id number]]  (Normal) Collected: 12/30/23 2046    Specimen: Swab from Anterior Nares Updated: 12/30/23 2121     SARS-CoV-2 (COVID-19) RNA Not Detected     Influenza A RNA Not  Detected     Influenza B RNA Not Detected    Narrative:      A result of "Detected" indicates POSITIVE for the presence of viral RNA  A result of "Not Detected" indicates NEGATIVE for the presence of viral RNA    Test performed using the Roche cobas Liat SARS-CoV-2 & Influenza A/B assay. This is a multiplex real-time RT-PCR assay for the detection of SARS-CoV-2, influenza A, and influenza B virus RNA. Viral nucleic acids may persist in vivo, independent of viability. Detection of viral nucleic acid does not imply the presence of infectious virus, or that virus nucleic acid is the cause of clinical symptoms. Negative results do not preclude SARS-CoV-2, influenza A, and/or influenza B infection and should not be used as the sole basis for diagnosis, treatment or other patient management decisions. Invalid results may be due to inhibiting substances in the specimen and recollection should occur.     Group A Strep, Rapid Antigen [[id number]]  (Normal) Collected: 12/30/23 2049    Specimen: Swab from Throat Updated: 12/30/23 2100     Group A Strep, Rapid Antigen Negative            CHART OWNERSHIP: I, Everlene Balls, PA,  am the primary clinician of record.           [1] History reviewed. No pertinent past medical history.  [2] No Known Allergies       Everlene Balls, [place]  01/07/24 [phone]

## 2024-06-22 ENCOUNTER — Emergency Department
Admission: EM | Admit: 2024-06-22 | Discharge: 2024-06-22 | Discharge status: Against Medical Advice | Attending: Emergency Medicine | Admitting: Emergency Medicine

## 2024-06-22 DIAGNOSIS — Z5321 Procedure and treatment not carried out due to patient leaving prior to being seen by health care provider: Secondary | ICD-10-CM | POA: Insufficient documentation

## 2024-06-22 DIAGNOSIS — R109 Unspecified abdominal pain: Secondary | ICD-10-CM | POA: Insufficient documentation

## 2024-06-22 LAB — COMPREHENSIVE METABOLIC PANEL
ALT: 10 U/L (ref <=55)
AST (SGOT): 22 U/L (ref <=41)
Albumin/Globulin Ratio: 1.2 (ref 0.9–2.2)
Albumin: 3.6 g/dL (ref 3.5–5.0)
Alkaline Phosphatase: 43 U/L (ref 37–117)
Anion Gap: 8 (ref 5.0–15.0)
BUN: 10 mg/dL (ref 7–21)
Bilirubin, Total: 0.7 mg/dL (ref 0.2–1.2)
CO2: 23 meq/L (ref 17–29)
Calcium: 8.5 mg/dL (ref 8.5–10.5)
Chloride: 108 meq/L (ref 99–111)
Creatinine: 0.7 mg/dL (ref 0.4–1.0)
GFR: >60 mL/min/1.73 m2 (ref >=60.0)
Globulin: 3.1 g/dL (ref 2.0–3.6)
Glucose: 88 mg/dL (ref 70–100)
Potassium: 4 meq/L (ref 3.5–5.3)
Protein, Total: 6.7 g/dL (ref 6.0–8.3)
Sodium: 139 meq/L (ref 135–145)

## 2024-06-22 LAB — LAB USE ONLY - CBC WITH DIFFERENTIAL
Absolute Basophils: 0.02 x10 3/uL (ref 0.00–0.08)
Absolute Eosinophils: 0.12 x10 3/uL (ref 0.00–0.44)
Absolute Immature Granulocytes: 0.02 x10 3/uL (ref 0.00–0.07)
Absolute Lymphocytes: 2.01 x10 3/uL (ref 0.42–3.22)
Absolute Monocytes: 0.77 x10 3/uL (ref 0.21–0.85)
Absolute Neutrophils: 5.64 x10 3/uL (ref 1.10–6.33)
Absolute nRBC: 0 x10 3/uL (ref <=0.00)
Basophils %: 0.2 %
Eosinophils %: 1.4 %
Hematocrit: 35.5 % (ref 34.7–43.7)
Hemoglobin: 11.4 g/dL (ref 11.4–14.8)
Immature Granulocytes %: 0.2 %
Lymphocytes %: 23.4 %
MCH: 29.7 pg (ref 25.1–33.5)
MCHC: 32.1 g/dL (ref 31.5–35.8)
MCV: 92.4 fL (ref 78.0–96.0)
MPV: 10.4 fL (ref 8.9–12.5)
Monocytes %: 9 %
Neutrophils %: 65.8 %
Platelet Count: 266 x10 3/uL (ref 142–346)
Preliminary Absolute Neutrophil Count: 5.64 x10 3/uL (ref 1.10–6.33)
RBC: 3.84 x10 6/uL — ABNORMAL LOW (ref 3.90–5.10)
RDW: 14 % (ref 11–15)
WBC: 8.58 x10 3/uL (ref 3.10–9.50)
nRBC %: 0 /100{WBCs} (ref <=0.0)

## 2024-06-22 LAB — LIPASE: Lipase: 11 U/L (ref 8–78)

## 2024-06-22 LAB — BETA HCG QUANTITATIVE, PREGNANCY: hCG, Quantitative: <2.4 m[IU]/mL

## 2024-06-22 NOTE — ED Triage Notes (Signed)
 [facility] EMERGENCY DEPARTMENT  Provider in Triage Note        Patient Name: Haley Roman    Chief Complaint:   Chief Complaint   Patient presents with    Abdominal Pain       HPI: Daniel Johndrow is a 28 y.o. female, who has had a rapid medical screening evaluation initiated by myself.  Pt presents today with lower abdominal pain for the past 2 days with intermittent nausea and vomiting.  Pt is unsure if she could be pregnant or not.  She denies urinary symptoms.    Medical/Surgical/Social history: as per HPI    Vitals: BP 116/83   Pulse 65   Temp 98.1 F (36.7 C) (Oral)   Resp 16   LMP 05/27/2024 (Exact Date)   SpO2 99%     Pertinent brief exam:   Examination of area of concern: ambulatory, nad    Preliminary orders: labs, ua    Symptom based preliminary diagnosis/MDM: abdominal pain    Patient advised to remain in the ED until further evaluation can be performed. Patient instructed to notify staff of any changes in condition while waiting.  This assessment is an initial evaluation to expedite care.

## 2024-06-22 NOTE — ED Notes (Signed)
 Pt. Requested to first look nurse to have IV taken out so that she could leave due to the wait being too long. IV taken out, pt. Left ED prior to being seen.

## 2024-06-29 ENCOUNTER — Emergency Department

## 2024-06-29 ENCOUNTER — Emergency Department
Admission: EM | Admit: 2024-06-29 | Discharge: 2024-06-29 | Disposition: A | Discharge status: Home / Self Care | Attending: Emergency Medicine | Admitting: Emergency Medicine

## 2024-06-29 DIAGNOSIS — N939 Abnormal uterine and vaginal bleeding, unspecified: Secondary | ICD-10-CM

## 2024-06-29 DIAGNOSIS — O039 Complete or unspecified spontaneous abortion without complication: Secondary | ICD-10-CM | POA: Insufficient documentation

## 2024-06-29 HISTORY — DX: Depression, unspecified: F32.A

## 2024-06-29 HISTORY — DX: Iron deficiency anemia, unspecified: D50.9

## 2024-06-29 LAB — LAB USE ONLY - CBC WITH DIFFERENTIAL
Absolute Basophils: 0.02 x10 3/uL (ref 0.00–0.08)
Absolute Eosinophils: 0.12 x10 3/uL (ref 0.00–0.44)
Absolute Immature Granulocytes: 0.01 x10 3/uL (ref 0.00–0.07)
Absolute Lymphocytes: 1.53 x10 3/uL (ref 0.42–3.22)
Absolute Monocytes: 0.47 x10 3/uL (ref 0.21–0.85)
Absolute Neutrophils: 3.31 x10 3/uL (ref 1.10–6.33)
Absolute nRBC: 0 x10 3/uL (ref <=0.00)
Basophils %: 0.4 %
Eosinophils %: 2.2 %
Hematocrit: 36.5 % (ref 34.7–43.7)
Hemoglobin: 11.4 g/dL (ref 11.4–14.8)
Immature Granulocytes %: 0.2 %
Lymphocytes %: 28 %
MCH: 29.2 pg (ref 25.1–33.5)
MCHC: 31.2 g/dL — ABNORMAL LOW (ref 31.5–35.8)
MCV: 93.4 fL (ref 78.0–96.0)
MPV: 10.8 fL (ref 8.9–12.5)
Monocytes %: 8.6 %
Neutrophils %: 60.6 %
Platelet Count: 276 x10 3/uL (ref 142–346)
Preliminary Absolute Neutrophil Count: 3.31 x10 3/uL (ref 1.10–6.33)
RBC: 3.91 x10 6/uL (ref 3.90–5.10)
RDW: 14 % (ref 11–15)
WBC: 5.46 x10 3/uL (ref 3.10–9.50)
nRBC %: 0 /100{WBCs} (ref <=0.0)

## 2024-06-29 LAB — COMPREHENSIVE METABOLIC PANEL
ALT: 11 U/L (ref <=55)
AST (SGOT): 19 U/L (ref <=41)
Albumin/Globulin Ratio: 1.2 (ref 0.9–2.2)
Albumin: 3.7 g/dL (ref 3.5–5.0)
Alkaline Phosphatase: 41 U/L (ref 37–117)
Anion Gap: 6 (ref 5.0–15.0)
BUN: 17 mg/dL (ref 7–21)
Bilirubin, Total: 0.2 mg/dL (ref 0.2–1.2)
CO2: 25 meq/L (ref 17–29)
Calcium: 9 mg/dL (ref 8.5–10.5)
Chloride: 109 meq/L (ref 99–111)
Creatinine: 0.7 mg/dL (ref 0.4–1.0)
GFR: >60 mL/min/1.73 m2 (ref >=60.0)
Globulin: 3 g/dL (ref 2.0–3.6)
Glucose: 79 mg/dL (ref 70–100)
Potassium: 4.2 meq/L (ref 3.5–5.3)
Protein, Total: 6.7 g/dL (ref 6.0–8.3)
Sodium: 140 meq/L (ref 135–145)

## 2024-06-29 LAB — URINALYSIS WITH REFLEX TO MICROSCOPIC EXAM - REFLEX TO CULTURE
Urine Bilirubin: NEGATIVE
Urine Blood: NEGATIVE
Urine Glucose: NEGATIVE
Urine Ketones: NEGATIVE mg/dL
Urine Nitrite: NEGATIVE
Urine Specific Gravity: 1.034 (ref 1.001–1.035)
Urine Urobilinogen: NORMAL mg/dL (ref 0.2–2.0)
Urine pH: 6 (ref 5.0–8.0)

## 2024-06-29 LAB — BETA HCG QUANTITATIVE, PREGNANCY: hCG, Quantitative: <2.4 m[IU]/mL

## 2024-06-29 LAB — BLOOD TYPE CONFIRMATION: Blood Type Confirmation: O POS

## 2024-06-29 LAB — TYPE AND SCREEN
ABO Rh: O POS
Antibody Screen: NEGATIVE

## 2024-06-29 NOTE — ED Triage Notes (Signed)
 [facility] EMERGENCY DEPARTMENT  Provider in Triage Note        Patient Name: Haley Roman    Chief Complaint:   Chief Complaint   Patient presents with    Vaginal Bleeding-pregnant    Abdominal Pain       HPI: Haley Roman is a 28 y.o. female, who has had a rapid medical screening evaluation initiated by myself. c/o vaginal bleeding and lower abd pain onset 2-3 hrs. Pt states is about [redacted] weeks pregnant. States pain is cramping, does not radiate. Reports has had to change pad one time.     Medical/Surgical/Social history: as per HPI    Vitals: BP 105/69   Pulse 71   Temp 98.1 F (36.7 C) (Oral)   Resp 18   LMP 05/04/2024 (Exact Date)   SpO2 99%     Pertinent brief exam:   Examination of area of concern:   NAD  Ambulatory  Speaking in full sentences    Preliminary orders: cbc, cmp, hcg, type and screen, UA, US     Symptom based preliminary diagnosis/MDM: abdominal cramping, vaginal bleeding in pregnancy    Patient advised to remain in the ED until further evaluation can be performed. Patient instructed to notify staff of any changes in condition while waiting.  This assessment is an initial evaluation to expedite care.

## 2024-06-29 NOTE — ED Provider Notes (Signed)
 EMERGENCY DEPARTMENT HISTORY AND PHYSICAL EXAM      History of Presenting Illness:  History Provided By: Patient    Haley Roman is a 27 y.o. female with ahx of iron deficiency anemia, depression co vaginal bleeding, lower abdominal crampin x 2 hours. States she is approx [redacted] weeks pregnant, took 3 home pregnancy tests all of which were positive. Thinks she is approx [redacted] weeks pregnant. Co lower abdominal pain and bleeding x 3 hours. Pain is dull, crampy sensation, bleeding scant.      Past Medical History:   Diagnosis Date    Depression     Iron deficiency anemia       Social Drivers of Health     Tobacco Use: Low Risk  (06/29/2024)    Patient History     Smoking Tobacco Use: Never     Smokeless Tobacco Use: Never     Passive Exposure: Not on file   Alcohol Use: Not At Risk (12/08/2022)    Received from [facility]    Alcohol     Audit-C Score: 0   Financial Resource Strain: Not on file   Food Insecurity: Patient Declined (06/29/2024)    Hunger Vital Sign     Worried About Running Out of Food in the Last Year: Patient declined     Ran Out of Food in the Last Year: Patient declined   Transportation Needs: Patient Declined (06/29/2024)    PRAPARE - Therapist, art (Medical): Patient declined     Lack of Transportation (Non-Medical): Patient declined   Physical Activity: Not on file   Stress: Not on file   Social Connections: Unknown (04/16/2022)    Received from [facility]    Social Network     Social Network: Not on file   Intimate Partner Violence: Patient Declined (06/29/2024)    Humiliation, Afraid, Rape, and Kick questionnaire     Fear of Current or Ex-Partner: Patient declined     Emotionally Abused: Patient declined     Physically Abused: Patient declined     Sexually Abused: Patient declined   Depression: At risk (09/25/2022)    Received from [facility]    PHQ-2     Patient Health Questionnaire-2 Score: 6   Postpartum Depression: Not on file   Housing Stability: Unknown (06/29/2024)     Housing Stability Vital Sign     Unable to Pay for Housing in the Last Year: Patient declined     Number of Times Moved in the Last Year: 0     Homeless in the Last Year: Patient declined   Utilities: Patient Declined (06/29/2024)    [facility] Utilities     Threatened with loss of utilities: Patient declined       Reviewed and confirmed past medical, surgical, family and social history as documented.    PCP: Pcp, None, MD  SPECIALISTS:    Physical Exam:  Vitals:    06/29/24 1333 06/29/24 1602   BP: 105/69 115/83   Pulse: 71 (!) 54   Resp: 18 17   Temp: 98.1 F (36.7 C) 97.7 F (36.5 C)   TempSrc: Oral Oral   SpO2: 99% 99%     Pulse Oximetry Interpretation: Normal     Physical Exam  Vitals and nursing note reviewed.   Constitutional:       General: She is not in acute distress.     Appearance: Normal appearance. She is not ill-appearing, toxic-appearing or diaphoretic.   HENT:  Head: Normocephalic and atraumatic.      Nose: Nose normal. No congestion or rhinorrhea.      Mouth/Throat:      Mouth: Mucous membranes are moist.      Pharynx: No oropharyngeal exudate or posterior oropharyngeal erythema.   Eyes:      General: No scleral icterus.     Extraocular Movements: Extraocular movements intact.      Pupils: Pupils are equal, round, and reactive to light.   Neck:      Vascular: No carotid bruit.   Cardiovascular:      Rate and Rhythm: Normal rate and regular rhythm.      Pulses: Normal pulses.      Heart sounds: Normal heart sounds. No murmur heard.     No friction rub. No gallop.   Pulmonary:      Effort: Pulmonary effort is normal. No respiratory distress.      Breath sounds: Normal breath sounds. No stridor. No wheezing, rhonchi or rales.   Chest:      Chest wall: No tenderness.   Abdominal:      General: Abdomen is flat. There is no distension.      Tenderness: There is no abdominal tenderness. There is no right CVA tenderness or left CVA tenderness.      Hernia: No hernia is present.   Musculoskeletal:          General: No tenderness. Normal range of motion.      Cervical back: Normal range of motion and neck supple. No rigidity or tenderness.      Right lower leg: No edema.      Left lower leg: No edema.   Lymphadenopathy:      Cervical: No cervical adenopathy.   Skin:     General: Skin is warm and dry.      Capillary Refill: Capillary refill takes less than 2 seconds.   Neurological:      General: No focal deficit present.      Mental Status: She is alert and oriented to person, place, and time.   Psychiatric:         Mood and Affect: Mood normal.         Behavior: Behavior normal.         Thought Content: Thought content normal.         Judgment: Judgment normal.          Cardiac Monitor  Rate:54  Rhythm: Normal Sinus Rhythm     Labs:   Abnormal Labs Reviewed   URINALYSIS WITH REFLEX TO MICROSCOPIC EXAM - REFLEX TO CULTURE - Abnormal; Notable for the following components:       Result Value    Urine Leukocyte Esterase Large (*)     Urine Protein 20= Trace (*)     Urine Squamous Epithelial Cells 11-25 (*)     Urine Mucus Present (*)     All other components within normal limits   LAB USE ONLY - CBC WITH DIFFERENTIAL - Abnormal; Notable for the following components:    MCHC 31.2 (*)     All other components within normal limits       All labs have been personally reviewed.     Imaging (Xray/CT/US /MRI):      US  OB < 14 Weeks W Transvag   Final Result      1. No pregnancy visualized.   2. Otherwise normal pelvic ultrasound.      JINNY Alverta Confer, MD  06/29/2024 3:03 PM        Reviewed and interpreted by me, confirmed by radiology report.    Summary of Prior Notes (date, source and summary):  Previous Notes: Yes. Reviewed ED notes from 06/22/24 that state seen for lower abdominal pain, uncertain if she was pregnant. Left prior to being seen.  Previous labs or imaging review: Yes. Beta quant on 06/22/24 was <2.4    Patient Update Notes:       Medication given in the ED:    ED Medication Orders (From admission, onward)      None             Provider Notes: ddx: ectopic pregnancy, early pregnancy, threatened abortion    No evidence of pregancy, likely completed miscarraige  She will follow up with her gyn and will return to the ed if her symptoms persist or worsen    Considered (meds/labs/imaging/admission): N/A.     Prescribed (and considered but not prescribed):   Discharge Medication List as of 06/29/2024  4:03 PM          Clinical Impression:   1. Vaginal bleeding    2. Miscarriage        ED Disposition       ED Disposition   Discharge    Condition   --    Date/Time   Mon Jun 29, 2024  4:02 PM    Comment   Jannessa Ogden discharge to home/self care.    Condition at disposition: Stable                   CHART OWNERSHIP: Dr. Rocky Freund is the primary emergency physician of record.    This note was generated by the Epic EMR system/ Dragon speech recognition and may contain inherent errors or omissions not intended by the user. Grammatical errors, random word insertions, deletions and pronoun errors  are occasional consequences of this technology due to software limitations. Not all errors are caught or corrected. If there are questions or concerns about the content of this note or information contained within the body of this dictation they should be addressed directly with the author for clarification     Freund Rocky BROCKS, DO  06/29/24 2304

## 2024-06-29 NOTE — ED Triage Notes (Signed)
 Haley Roman is a 28 y.o. female arrives to the ED ambulatory and A+Ox4 c/o vaginal bleeding and lower abd pain onset 2-3 hrs.  Pt states is about [redacted] weeks pregnant.  States pain is cramping, does not radiate.  Reports has had to change pad one time.  VSS.

## 2024-06-30 LAB — LAB USE ONLY - URINE GRAY CULTURE HOLD TUBE
